# Patient Record
Sex: Male | Born: 1990 | Race: Black or African American | Hispanic: No | Marital: Single | Smoking: Current every day smoker
Health system: Southern US, Community
[De-identification: ages and names within clinical notes are randomized; demographics above are authoritative.]

---
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
## 2017-11-25 ENCOUNTER — Emergency Department (HOSPITAL_BASED_OUTPATIENT_CLINIC_OR_DEPARTMENT_OTHER): Payer: Self-pay

## 2017-11-25 ENCOUNTER — Emergency Department (HOSPITAL_BASED_OUTPATIENT_CLINIC_OR_DEPARTMENT_OTHER)
Admission: EM | Admit: 2017-11-25 | Discharge: 2017-11-25 | Disposition: A | Discharge status: Home / Self Care | Payer: Self-pay | Attending: Emergency Medicine | Admitting: Emergency Medicine

## 2017-11-25 ENCOUNTER — Encounter (HOSPITAL_BASED_OUTPATIENT_CLINIC_OR_DEPARTMENT_OTHER): Payer: Self-pay | Admitting: Emergency Medicine

## 2017-11-25 ENCOUNTER — Other Ambulatory Visit: Payer: Self-pay

## 2017-11-25 DIAGNOSIS — R042 Hemoptysis: Secondary | ICD-10-CM | POA: Insufficient documentation

## 2017-11-25 DIAGNOSIS — F17200 Nicotine dependence, unspecified, uncomplicated: Secondary | ICD-10-CM | POA: Insufficient documentation

## 2017-11-25 LAB — GROUP A STREP BY PCR: GROUP A STREP BY PCR: NOT DETECTED

## 2017-11-25 MED ORDER — ACETAMINOPHEN 325 MG PO TABS
650.0000 mg | ORAL_TABLET | Freq: Once | ORAL | Status: AC
  Administered 2017-11-25: 650 mg via ORAL
  Filled 2017-11-25: qty 2

## 2017-11-25 MED ORDER — PANTOPRAZOLE SODIUM 20 MG PO TBEC: 20.0000 mg | DELAYED_RELEASE_TABLET | Freq: Every day | ORAL | 0 refills | Status: DC

## 2017-11-25 NOTE — ED Triage Notes (Signed)
Chest congestion with hemoptysis since Sunday night..  Worse at night and in am.  No fever noted.  Some headache and cough.

## 2017-11-25 NOTE — ED Notes (Signed)
ED Provider at bedside. 

## 2017-11-25 NOTE — ED Notes (Signed)
Patient transported to X-ray 

## 2017-11-25 NOTE — Discharge Instructions (Addendum)
You have been diagnosed today with hemoptysis.  At this time there does not appear to be the presence of an emergent medical condition, however there is always the potential for conditions to worsen. Please read and follow the below instructions.  Please return to the Emergency Department immediately for any new or worsening symptoms or if your symptoms do not improve. Please be sure to follow up with your Primary Care Provider as soon as possible regarding your visit today; please call their office to schedule an appointment even if you are feeling better for a follow-up visit. Please take the Protonix daily as prescribed.  Please cut down on your alcohol intake, it is likely that your bloody cough is due to your increased alcohol intake and vomiting.  Contact a doctor if: You have a fever. You cough up bloody spit and mucus. Get help right away if: You cough up fresh blood or blood clots. You have trouble breathing. You have chest pain.  Please read the additional information packets attached to your discharge summary.  Do not take your medicine if  develop an itchy rash, swelling in your mouth or lips, or difficulty breathing.

## 2017-11-25 NOTE — ED Provider Notes (Signed)
Medical screening examination/treatment/procedure(s) were conducted as a shared visit with non-physician practitioner(s) and myself.  I personally evaluated the patient during the encounter. Briefly, the patient is a 27 y.o. male with no significant medical history presents the ED following episodes of some coughing up blood.  Patient with normal vitals.  No fever.  Patient states several episodes of some blood when he coughs.  Small amount possibly to cover a napkin.  Patient states that he is a heavy alcohol drinker.  Does not use any Tylenol, Motrin, Goody powders.  Patient with no abdominal pain.  Normal vitals upon my evaluation.  Overall well-appearing.  No active hemoptysis.  Chest x-ray showed no signs of pneumonia, pneumothorax, pleural effusion.  No free air on chest x-ray as well.  Patient with normal hemoglobin.  Suspect likely small Mallory-Weiss tear versus alcoholic gastritis.  Educated on alcohol use.  Told to avoid any Motrin, ibuprofen.  Given prescription for Protonix.  Recommend follow-up with primary care doctor and discharged from ED in good condition.  Given return precautions.  This chart was dictated using voice recognition software.  Despite best efforts to proofread,  errors can occur which can change the documentation meaning.    EKG Interpretation None            Virgina Norfolk, DO 11/25/17 1923

## 2017-11-25 NOTE — ED Provider Notes (Signed)
[facility] EMERGENCY DEPARTMENT Provider Note   CSN: [id number] Arrival date & time: 11/25/17  1632     History   Chief Complaint Chief Complaint  Patient presents with  . Hemoptysis    HPI Adam Bailey is a 27 y.o. male presenting today for cough and hemoptysis that began 3 nights ago.  Patient states that he developed a gradually worsening cough on Sunday evening with hemoptysis starting the following day.  Patient states that during the daytime his cough is very mild and without hemoptysis however at night and first thing in the morning his cough is worsened with increased hemoptysis.  He states that his cough is harsh and burns his throat.  Patient has not taken any medication for his symptoms.  Patient states that he is an otherwise healthy 27 year old male without history of chronic medical diseases however he is a smoker.  Patient denies chest pain, shortness of breath, fever, rhinorrhea, difficulty swallowing/breathing, extremity swelling/color change, abdominal pain, nausea/vomiting, diarrhea, weight loss, cancer history.  Patient denies recent surgery/immobilization, exogenous hormone use or history of blood clots.  Patient's mother is at bedside, they both deny family history of cardiac disease under the age of 50, sudden cardiac death, blood clots.  HPI  History reviewed. No pertinent past medical history.  There are no active problems to display for this patient.      Home Medications    Prior to Admission medications   Medication Sig Start Date End Date Taking? Authorizing Provider  pantoprazole (PROTONIX) 20 MG tablet Take 1 tablet (20 mg total) by mouth daily. 11/25/17   Bill Salinas, PA-C    Family History No family history on file.  Social History Social History   Tobacco Use  . Smoking status: Current Every Day Smoker  . Smokeless tobacco: Never Used  Substance Use Topics  . Alcohol use: Not on file  . Drug use: Not on file      Allergies   Patient has no known allergies.   Review of Systems Review of Systems  Constitutional: Negative.  Negative for chills and fever.  HENT: Positive for sore throat. Negative for congestion, rhinorrhea, trouble swallowing and voice change.   Respiratory: Positive for cough. Negative for shortness of breath.   Cardiovascular: Negative.  Negative for chest pain and leg swelling.  Gastrointestinal: Negative.  Negative for abdominal pain, nausea and vomiting.  Skin: Negative.  Negative for color change and rash.  Neurological: Negative.  Negative for weakness and headaches.   Physical Exam Updated Vital Signs BP 114/75   Pulse 84   Temp 98.6 F (37 C) (Oral)   Resp 18   Ht 6\' 1"  (1.854 m)   Wt 64.9 kg   SpO2 99%   BMI 18.87 kg/m   Physical Exam  Constitutional: He appears well-developed and well-nourished. No distress.  HENT:  Head: Normocephalic and atraumatic.  Right Ear: Hearing, tympanic membrane, external ear and ear canal normal.  Left Ear: Hearing, tympanic membrane, external ear and ear canal normal.  Nose: Mucosal edema and rhinorrhea present.  Mouth/Throat: Uvula is midline, oropharynx is clear and moist and mucous membranes are normal. No trismus in the jaw. No uvula swelling. No oropharyngeal exudate, posterior oropharyngeal edema, posterior oropharyngeal erythema or tonsillar abscesses.  The patient has normal phonation and is in control of secretions. No stridor.  Midline uvula without edema. Soft palate rises symmetrically. No tonsillar erythema, swelling or exudates. Tongue protrusion is normal, floor of mouth is soft. No  trismus. No creptius on neck palpation. No gingival erythema or fluctuance noted. Mucus membranes moist. No pallor noted.  Mild cobblestoning posterior pharynx consistent with postnasal drip.   Eyes: Pupils are equal, round, and reactive to light. Conjunctivae and EOM are normal.  Neck: Trachea normal, normal range of motion, full  passive range of motion without pain and phonation normal. Neck supple. No tracheal deviation present.  Cardiovascular: Normal rate, regular rhythm, normal heart sounds and intact distal pulses.  Pulses:      Radial pulses are 2+ on the right side, and 2+ on the left side.       Dorsalis pedis pulses are 2+ on the right side, and 2+ on the left side.       Posterior tibial pulses are 2+ on the right side, and 2+ on the left side.  Pulmonary/Chest: Effort normal and breath sounds normal. No respiratory distress. He exhibits no tenderness, no crepitus and no deformity.  Abdominal: Soft. Bowel sounds are normal. There is no tenderness. There is no rigidity, no rebound, no guarding, no tenderness at McBurney's point and negative Murphy's sign.  Musculoskeletal: Normal range of motion.       Right lower leg: Normal.       Left lower leg: Normal.  Feet:  Right Foot:  Protective Sensation: 3 sites tested. 3 sites sensed.  Left Foot:  Protective Sensation: 3 sites tested. 3 sites sensed.  Neurological: He is alert. GCS eye subscore is 4. GCS verbal subscore is 5. GCS motor subscore is 6.  Speech is clear and goal oriented, follows commands Major Cranial nerves without deficit, no facial droop Normal strength in upper and lower extremities bilaterally including dorsiflexion and plantar flexion, strong and equal grip strength Sensation normal to light touch Moves extremities without ataxia, coordination intact Normal gait  Skin: Skin is warm and dry. Capillary refill takes less than 2 seconds. He is not diaphoretic.  Psychiatric: He has a normal mood and affect. His behavior is normal.   ED Treatments / Results  Labs (all labs ordered are listed, but only abnormal results are displayed) Labs Reviewed  GROUP A STREP BY PCR    EKG None  Radiology Dg Chest 2 View  Result Date: 11/25/2017 CLINICAL DATA:  Cough with hemoptysis EXAM: CHEST - 2 VIEW COMPARISON:  None. FINDINGS: The heart size  and mediastinal contours are within normal limits. Both lungs are clear. The visualized skeletal structures are unremarkable. IMPRESSION: No active cardiopulmonary disease. Electronically Signed   By: Jasmine Pang M.D.   On: 11/25/2017 18:52    Procedures Procedures (including critical care time)  Medications Ordered in ED Medications  acetaminophen (TYLENOL) tablet 650 mg (650 mg Oral Given 11/25/17 1724)     Initial Impression / Assessment and Plan / ED Course  I have reviewed the triage vital signs and the nursing notes.  Pertinent labs & imaging results that were available during my care of the patient were reviewed by me and considered in my medical decision making (see chart for details).  Clinical Course as of Nov 26 1939  Wed Nov 25, 2017  1908 Seen and evaluated by Dr. Lockie Mola advises; Protonix 20mg  daily and avoid overuse of alcohol.   [BM]    Clinical Course User Index [BM] Bill Salinas, PA-C   27 year old otherwise healthy male presenting today with 3 days of hemoptysis.  Hemoptysis worse at night, minimal cough or hemoptysis during the day.  Strep test negative and chest x-ray unremarkable.  Patient initially mildly tachycardic upon arrival, after rest and Tylenol patient is no longer tachycardic.  Patient seen and evaluated by Dr. Lockie Mola during this visit, please see his note for further details.  In short patient revealed history of heavy alcohol use, likely today that patient with a small Mallory-Weiss tear versus alcoholic gastritis.  The patient has been prescribed Protonix 20 mg daily, discharge at this time and encouraged to follow-up with PCP for reevaluation.  Patient counseled on alcohol use by Dr. Lockie Mola.  Afebrile, not tachycardic, not hypotensive, not tachypneic, 90% SPO2 on room air.  Well-appearing and in no acute distress.  Patient and his mother both state understanding of work-up today and are agreeable with discharge.  At this time there  does not appear to be any evidence of an acute emergency medical condition and the patient appears stable for discharge with appropriate outpatient follow up. Diagnosis was discussed with patient who verbalizes understanding of care plan and is agreeable to discharge. I have discussed return precautions with patient and mother who verbalize understanding of return precautions. Patient strongly encouraged to follow-up with their PCP. All questions answered.   Note: Portions of this report may have been transcribed using voice recognition software. Every effort was made to ensure accuracy; however, inadvertent computerized transcription errors may still be present. Final Clinical Impressions(s) / ED Diagnoses   Final diagnoses:  Hemoptysis    ED Discharge Orders         Ordered    pantoprazole (PROTONIX) 20 MG tablet  Daily     11/25/17 1919           Elizabeth Palau 11/25/17 1947    Virgina Norfolk, DO 11/25/17 2311    Virgina Norfolk, DO 11/25/17 2311

## 2021-05-02 ENCOUNTER — Emergency Department (HOSPITAL_BASED_OUTPATIENT_CLINIC_OR_DEPARTMENT_OTHER)
Admission: EM | Admit: 2021-05-02 | Discharge: 2021-05-02 | Disposition: A | Discharge status: Home / Self Care | Payer: Self-pay | Attending: Emergency Medicine | Admitting: Emergency Medicine

## 2021-05-02 ENCOUNTER — Other Ambulatory Visit (HOSPITAL_BASED_OUTPATIENT_CLINIC_OR_DEPARTMENT_OTHER): Payer: Self-pay

## 2021-05-02 ENCOUNTER — Encounter (HOSPITAL_BASED_OUTPATIENT_CLINIC_OR_DEPARTMENT_OTHER): Payer: Self-pay | Admitting: Emergency Medicine

## 2021-05-02 ENCOUNTER — Emergency Department (HOSPITAL_BASED_OUTPATIENT_CLINIC_OR_DEPARTMENT_OTHER): Payer: Self-pay

## 2021-05-02 ENCOUNTER — Other Ambulatory Visit: Payer: Self-pay

## 2021-05-02 DIAGNOSIS — D72819 Decreased white blood cell count, unspecified: Secondary | ICD-10-CM | POA: Insufficient documentation

## 2021-05-02 DIAGNOSIS — J40 Bronchitis, not specified as acute or chronic: Secondary | ICD-10-CM | POA: Insufficient documentation

## 2021-05-02 DIAGNOSIS — R051 Acute cough: Secondary | ICD-10-CM

## 2021-05-02 DIAGNOSIS — Z87891 Personal history of nicotine dependence: Secondary | ICD-10-CM | POA: Insufficient documentation

## 2021-05-02 LAB — CBC
HCT: 38.8 % — ABNORMAL LOW (ref 39.0–52.0)
Hemoglobin: 13.4 g/dL (ref 13.0–17.0)
MCH: 31.3 pg (ref 26.0–34.0)
MCHC: 34.5 g/dL (ref 30.0–36.0)
MCV: 90.7 fL (ref 80.0–100.0)
Platelets: 252 10*3/uL (ref 150–400)
RBC: 4.28 MIL/uL (ref 4.22–5.81)
RDW: 11.9 % (ref 11.5–15.5)
WBC: 2.8 10*3/uL — ABNORMAL LOW (ref 4.0–10.5)
nRBC: 0 % (ref 0.0–0.2)

## 2021-05-02 LAB — COMPREHENSIVE METABOLIC PANEL
ALT: 19 U/L (ref 0–44)
AST: 24 U/L (ref 15–41)
Albumin: 4.4 g/dL (ref 3.5–5.0)
Alkaline Phosphatase: 72 U/L (ref 38–126)
Anion gap: 9 (ref 5–15)
BUN: 7 mg/dL (ref 6–20)
CO2: 24 mmol/L (ref 22–32)
Calcium: 9.2 mg/dL (ref 8.9–10.3)
Chloride: 100 mmol/L (ref 98–111)
Creatinine, Ser: 0.72 mg/dL (ref 0.61–1.24)
GFR, Estimated: >60 mL/min (ref >60)
Glucose, Bld: 104 mg/dL — ABNORMAL HIGH (ref 70–99)
Potassium: 3.6 mmol/L (ref 3.5–5.1)
Sodium: 133 mmol/L — ABNORMAL LOW (ref 135–145)
Total Bilirubin: 0.7 mg/dL (ref 0.3–1.2)
Total Protein: 8 g/dL (ref 6.5–8.1)

## 2021-05-02 MED ORDER — DEXAMETHASONE 4 MG PO TABS
10.0000 mg | ORAL_TABLET | Freq: Once | ORAL | Status: AC
  Administered 2021-05-02: 10 mg via ORAL
  Filled 2021-05-02: qty 3

## 2021-05-02 MED ORDER — AZITHROMYCIN 250 MG PO TABS
250.0000 mg | ORAL_TABLET | Freq: Every day | ORAL | 0 refills | Status: DC
  Filled 2021-05-02: qty 6, 5d supply, fill #0

## 2021-05-02 NOTE — ED Provider Notes (Signed)
?[facility] EMERGENCY DEPARTMENT ?Provider Note ? ? ?CSN: [id number] ?Arrival date & time: 05/02/21  1335 ? ?  ? ?History ? ?Chief Complaint  ?Patient presents with  ? Hemoptysis  ? ? ?Adam Bailey is a 31 y.o. male. ? ?Patient here after coughing up some blood.  This occurred after smoking.  History of smoking but no other major medical problems.  Denies any recent surgery, travel, blood clot history.  No family history of blood clots.  States he used to be on reflux medicine.  No longer drinks alcohol.  Denies any vomiting of blood.  Symptoms have resolved.  Denies any chest pain or shortness of breath.  Nothing has made it better or worse. ? ?The history is provided by the patient.  ? ?  ? ?Home Medications ?Prior to Admission medications   ?Medication Sig Start Date End Date Taking? Authorizing Provider  ?azithromycin (ZITHROMAX) 250 MG tablet Take 1 tablet (250 mg total) by mouth daily. Take first 2 tablets together, then 1 every day until finished. 05/02/21  Yes Adam Elsass, DO  ?pantoprazole (PROTONIX) 20 MG tablet Take 1 tablet (20 mg total) by mouth daily. 11/25/17   Adam Salinas, PA-C  ?   ? ?Allergies    ?Patient has no known allergies.   ? ?Review of Systems   ?Review of Systems ? ?Physical Exam ?Updated Vital Signs ?BP 139/90 (BP Location: Right Arm)   Pulse 76   Temp 98.3 ?F (36.8 ?C) (Oral)   Resp 18   Ht 5\' 10"  (1.778 m)   Wt 67.1 kg   SpO2 100%   BMI 21.24 kg/m?  ?Physical Exam ?Vitals and nursing note reviewed.  ?Constitutional:   ?   General: He is not in acute distress. ?   Appearance: He is well-developed. He is not ill-appearing.  ?HENT:  ?   Head: Normocephalic and atraumatic.  ?   Mouth/Throat:  ?   Mouth: Mucous membranes are moist.  ?Eyes:  ?   Extraocular Movements: Extraocular movements intact.  ?   Conjunctiva/sclera: Conjunctivae normal.  ?   Pupils: Pupils are equal, round, and reactive to light.  ?Cardiovascular:  ?   Rate and Rhythm: Normal rate and regular  rhythm.  ?   Pulses: Normal pulses.  ?   Heart sounds: Normal heart sounds. No murmur heard. ?Pulmonary:  ?   Effort: Pulmonary effort is normal. No respiratory distress.  ?   Breath sounds: Normal breath sounds.  ?Abdominal:  ?   Palpations: Abdomen is soft.  ?   Tenderness: There is no abdominal tenderness.  ?Musculoskeletal:     ?   General: No swelling.  ?   Cervical back: Normal range of motion and neck supple.  ?Skin: ?   General: Skin is warm and dry.  ?   Capillary Refill: Capillary refill takes less than 2 seconds.  ?Neurological:  ?   Mental Status: He is alert.  ?Psychiatric:     ?   Mood and Affect: Mood normal.  ? ? ?ED Results / Procedures / Treatments   ?Labs ?(all labs ordered are listed, but only abnormal results are displayed) ?Labs Reviewed  ?CBC - Abnormal; Notable for the following components:  ?    Result Value  ? WBC 2.8 (*)   ? HCT 38.8 (*)   ? All other components within normal limits  ?COMPREHENSIVE METABOLIC PANEL - Abnormal; Notable for the following components:  ? Sodium 133 (*)   ? Glucose,  Bld 104 (*)   ? All other components within normal limits  ? ? ?EKG ?None ? ?Radiology ?DG Chest Portable 1 View ? ?Result Date: 05/02/2021 ?CLINICAL DATA:  Cough. EXAM: PORTABLE CHEST 1 VIEW COMPARISON:  Chest two views 11/25/2017 FINDINGS: Cardiac silhouette and mediastinal contours are within normal limits. The lungs are clear. No pleural effusion or pneumothorax. No acute skeletal abnormality. IMPRESSION: No active disease. Electronically Signed   By: Neita Garnet M.D.   On: 05/02/2021 15:17   ? ?Procedures ?Procedures  ? ? ?Medications Ordered in ED ?Medications  ?dexamethasone (DECADRON) tablet 10 mg (has no administration in time range)  ? ? ?ED Course/ Medical Decision Making/ A&P ?  ?                        ?Medical Decision Making ?Amount and/or Complexity of Data Reviewed ?Labs: ordered. ?Radiology: ordered. ? ?Risk ?Prescription drug management. ? ? ?Adam Bailey is here with cough and 1  episode of may be some blood in his sputum.  Denies any fevers or chills.  Normal vitals.  No fever.  Patient is PERC negative and doubt PE.  Is a smoker and likely inflammatory or bronchitis process.  Possibly viral process.  Possibly pneumonia.  We will check basic labs including CBC, BMP, chest x-ray.  We will treat with a dose of Decadron plus or minus antibiotics depending on chest x-ray. ? ?Chest x-ray per my review and interpretation shows no pneumonia.  Will treat with Decadron and Z-Pak for likely bronchitis/inflammation.  Per my review and interpretation of labs there is no significant anemia, electrolyte abnormality.  White count is 2.8 likely from viral suppression.  Discharged in good condition. ? ?This chart was dictated using voice recognition software.  Despite best efforts to proofread,  errors can occur which can change the documentation meaning.  ? ? ? ? ? ? ? ?Final Clinical Impression(s) / ED Diagnoses ?Final diagnoses:  ?Acute cough  ?Bronchitis  ? ? ?Rx / DC Orders ?ED Discharge Orders   ? ?      Ordered  ?  azithromycin (ZITHROMAX) 250 MG tablet  Daily       ? 05/02/21 1521  ? ?  ?  ? ?  ? ? ?  ?Virgina Norfolk, DO ?05/02/21 1522 ? ?

## 2021-05-02 NOTE — ED Triage Notes (Signed)
Pt states that he stopped drinking 1.5 years ago  ?

## 2021-05-02 NOTE — Discharge Instructions (Addendum)
Chest x-ray showed no evidence of pneumonia.  I have treated you with a long-acting steroid to help with inflammation.  I have also prescribed an antibiotic to help with inflammation.  Please consider stopping smoking. ?

## 2021-05-02 NOTE — ED Triage Notes (Signed)
Pt here from work was smoking and started coughing and spit up some blood , no others complaints  ?

## 2022-12-03 IMAGING — DX DG CHEST 1V PORT
2 series · 2 of 2 positions shown · non-contrast
Comparison: Chest two views 11/25/2017

CLINICAL DATA: Cough.

EXAM:
PORTABLE CHEST 1 VIEW

[chest ap (1 of 2)]
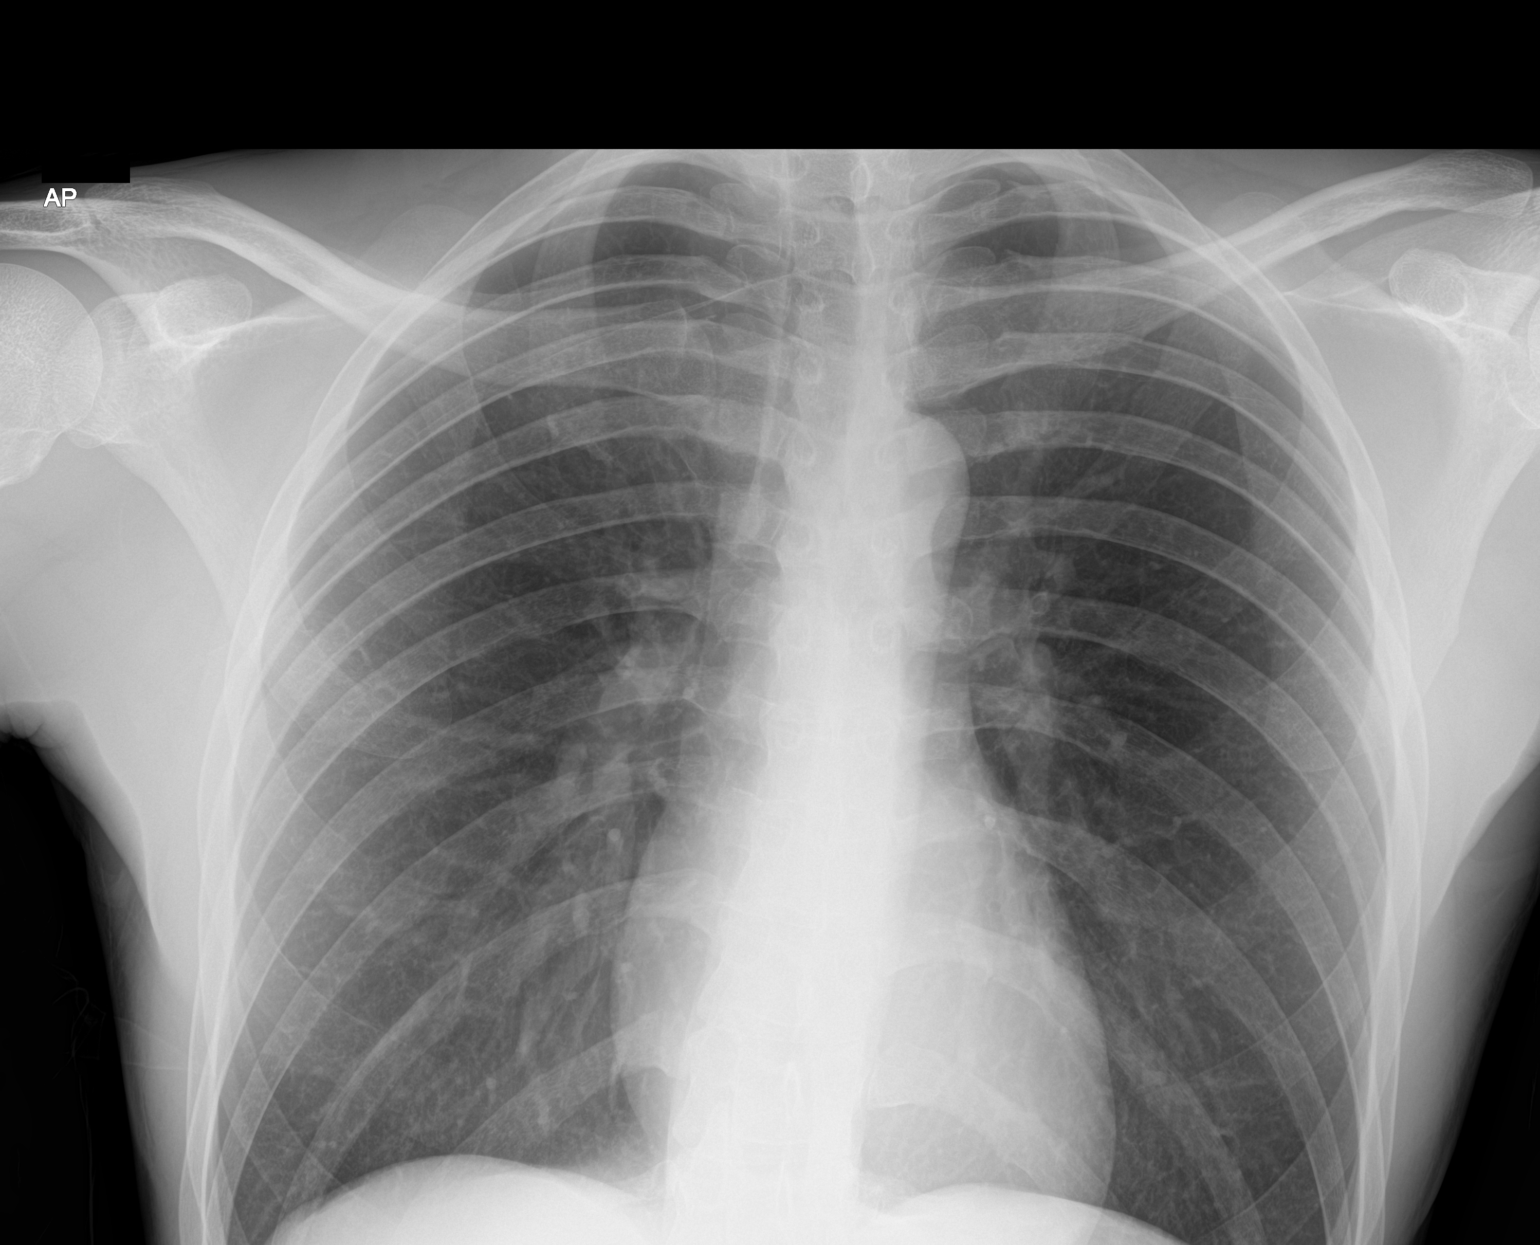

[chest ap (2 of 2)]
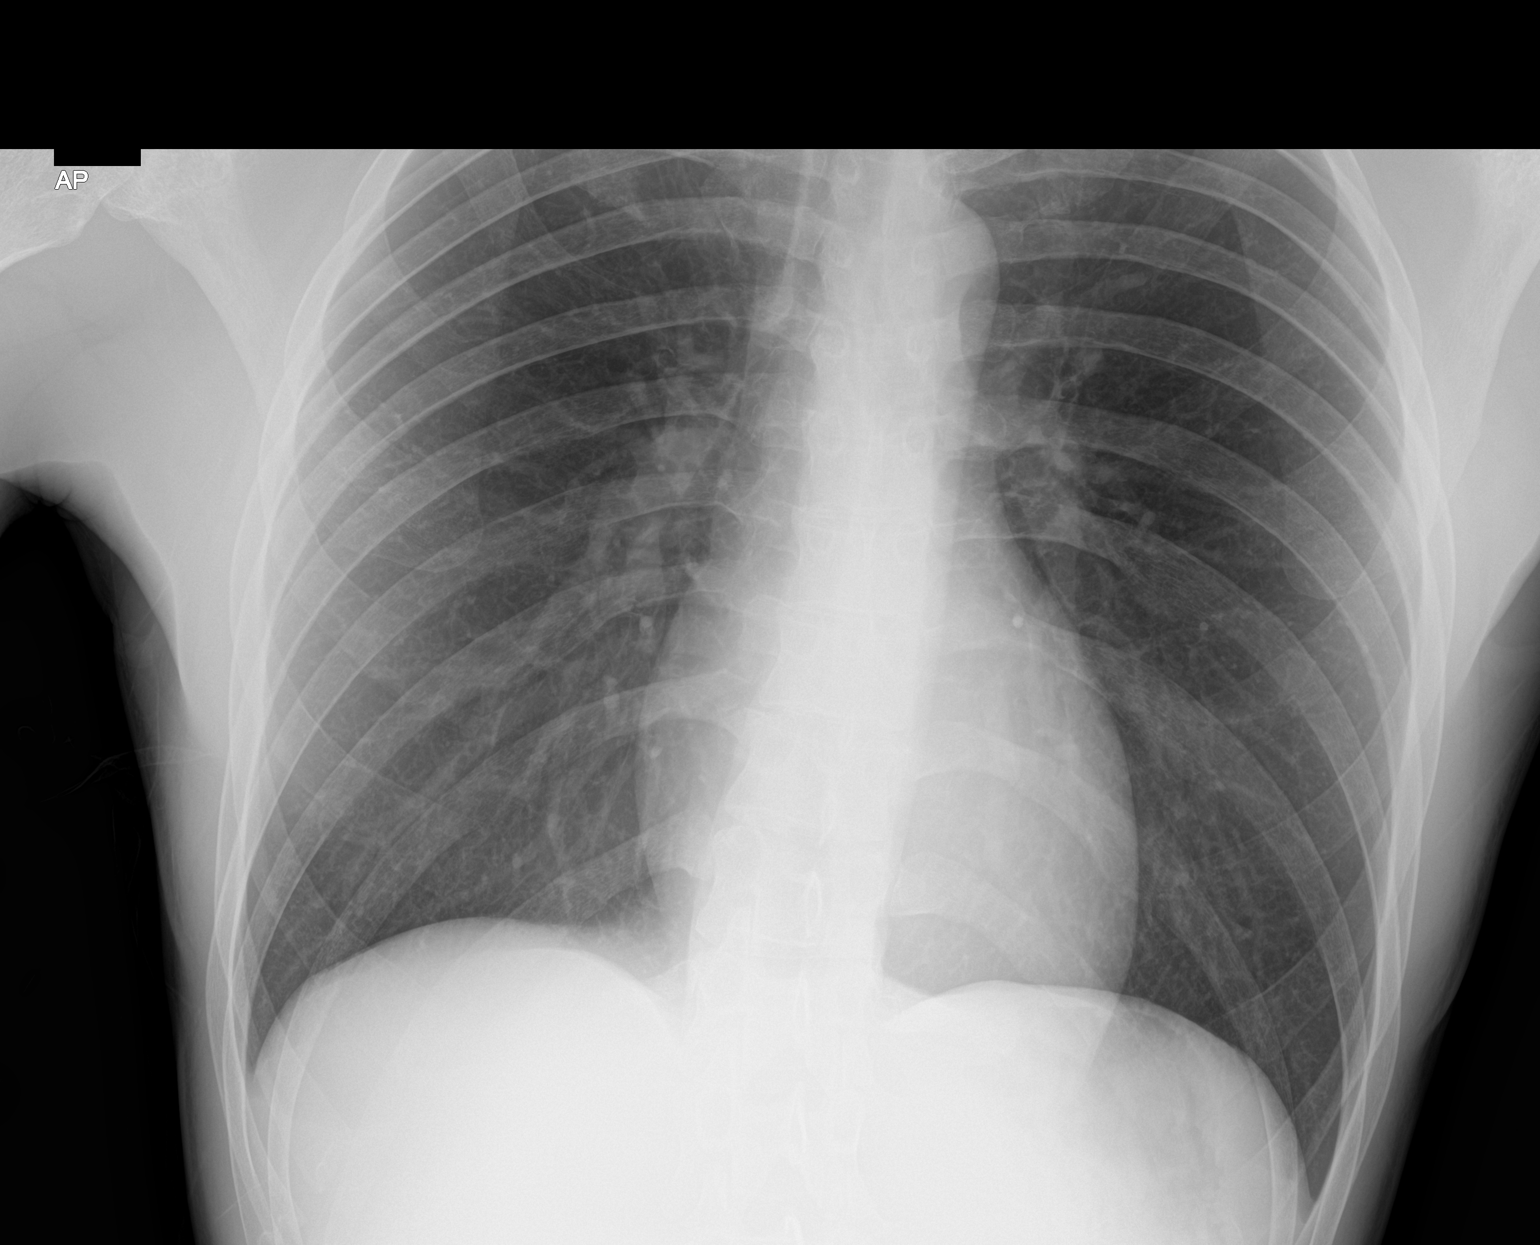

[2 of 2 positions shown; findings below may reference images not displayed]

FINDINGS: Cardiac silhouette and mediastinal contours are within normal
limits. The lungs are clear. No pleural effusion or pneumothorax. No
acute skeletal abnormality.
IMPRESSION: No active disease.

## 2023-08-02 ENCOUNTER — Encounter (HOSPITAL_COMMUNITY): Payer: Self-pay

## 2023-08-02 ENCOUNTER — Ambulatory Visit (HOSPITAL_COMMUNITY)
Admission: EM | Admit: 2023-08-02 | Discharge: 2023-08-02 | Disposition: A | Discharge status: Home / Self Care | Payer: Self-pay | Attending: Emergency Medicine | Admitting: Emergency Medicine

## 2023-08-02 DIAGNOSIS — R197 Diarrhea, unspecified: Secondary | ICD-10-CM

## 2023-08-02 DIAGNOSIS — B349 Viral infection, unspecified: Secondary | ICD-10-CM

## 2023-08-02 DIAGNOSIS — R051 Acute cough: Secondary | ICD-10-CM

## 2023-08-02 MED ORDER — AZELASTINE HCL 0.1 % NA SOLN: 2.0000 | Freq: Two times a day (BID) | NASAL | 0 refills | Status: AC

## 2023-08-02 MED ORDER — LOPERAMIDE HCL 2 MG PO CAPS: 2.0000 mg | ORAL_CAPSULE | Freq: Four times a day (QID) | ORAL | 0 refills | Status: AC | PRN

## 2023-08-02 MED ORDER — BENZONATATE 100 MG PO CAPS: 100.0000 mg | ORAL_CAPSULE | Freq: Three times a day (TID) | ORAL | 0 refills | Status: AC

## 2023-08-02 MED ORDER — PROMETHAZINE-DM 6.25-15 MG/5ML PO SYRP: 5.0000 mL | ORAL_SOLUTION | Freq: Every evening | ORAL | 0 refills | Status: AC | PRN

## 2023-08-02 NOTE — ED Triage Notes (Signed)
 Patient has multiple complaints. Patient reports that he began having nasal congestion and a productive cough with light yellow and specks of blood in the sputum, intermittent diarrhea, headache, fatigue, and abdominal cramping x 6 days.  Patient denies taking any medication for his symptoms.

## 2023-08-02 NOTE — Discharge Instructions (Addendum)
 As discussed I believe your symptoms are likely related to a viral illness. You can take Tessalon  every 8 hours as needed for cough. You can take Promethazine  DM cough syrup at bedtime as needed for cough.  This can make you drowsy so do not drive, work, or drink alcohol while taking this. Use azelastine  nasal spray twice daily to help with congestion. You can take Imodium  4 times daily as needed for diarrhea. Otherwise make sure you are staying hydrated and getting plenty of rest. Return here if your symptoms persist or worsen. If you develop excessive vomiting, severe abdominal pain, fevers unrelieved by medication, passing out, or coughing up a significant amount of blood please seek immediate medical treatment in the emergency department.

## 2023-08-02 NOTE — ED Provider Notes (Signed)
 [facility]    CSN: [id number] Arrival date & time: 08/02/23  1004      History   Chief Complaint Chief Complaint  Patient presents with   Abdominal Pain   Cough   Diarrhea   Fatigue   Headache    HPI Adam Bailey is a 33 y.o. male.   Patient presents with congestion and productive cough that began about 6 days ago.  Patient states that he has noticed a few specks of blood in his sputum began yesterday.  Patient states that he also did have some chills and sweats overnight last night.  Denies coughing up large amounts of blood or blood clots.  Denies chest pain, shortness of breath, and known fever.  Patient states that he has also had some intermittent abdominal cramping, diarrhea, fatigue, and intermittent headache over the last 6 days as well.  Patient does endorse some mild nausea with this as well.  Denies vomiting, blood in stool, dizziness, lightheadedness, or loss of consciousness.    Patient denies taking any medications for symptoms.  Patient denies any known sick exposures.  The history is provided by the patient and medical records.  Abdominal Pain Associated symptoms: cough and diarrhea   Cough Associated symptoms: headaches   Diarrhea Associated symptoms: abdominal pain and headaches   Headache Associated symptoms: abdominal pain, cough and diarrhea     History reviewed. No pertinent past medical history.  There are no active problems to display for this patient.   History reviewed. No pertinent surgical history.     Home Medications    Prior to Admission medications   Medication Sig Start Date End Date Taking? Authorizing Provider  azelastine  (ASTELIN ) 0.1 % nasal spray Place 2 sprays into both nostrils 2 (two) times daily. Use in each nostril as directed 08/02/23  Yes Johnie Flaming A, NP  benzonatate  (TESSALON ) 100 MG capsule Take 1 capsule (100 mg total) by mouth every 8 (eight) hours. 08/02/23  Yes Johnie, Paiten Boies A, NP  loperamide   (IMODIUM ) 2 MG capsule Take 1 capsule (2 mg total) by mouth 4 (four) times daily as needed for diarrhea or loose stools. 08/02/23  Yes Johnie Flaming A, NP  promethazine -dextromethorphan (PROMETHAZINE -DM) 6.25-15 MG/5ML syrup Take 5 mLs by mouth at bedtime as needed for cough. 08/02/23  Yes Johnie Flaming LABOR, NP    Family History Family History  Problem Relation Age of Onset   Diabetes Mother     Social History Social History   Tobacco Use   Smoking status: Every Day    Current packs/day: 0.50    Types: Cigarettes   Smokeless tobacco: Never  Vaping Use   Vaping status: Never Used  Substance Use Topics   Alcohol use: Yes    Comment: drinks daily  3-4 beers   Drug use: Never     Allergies   Other   Review of Systems Review of Systems  Respiratory:  Positive for cough.   Gastrointestinal:  Positive for abdominal pain and diarrhea.  Neurological:  Positive for headaches.   Per HPI  Physical Exam Triage Vital Signs ED Triage Vitals  Encounter Vitals Group     BP 08/02/23 1032 (!) 142/96     Girls Systolic BP Percentile --      Girls Diastolic BP Percentile --      Boys Systolic BP Percentile --      Boys Diastolic BP Percentile --      Pulse Rate 08/02/23 1032 97  Resp 08/02/23 1032 14     Temp 08/02/23 1032 98 F (36.7 C)     Temp Source 08/02/23 1032 Oral     SpO2 08/02/23 1032 97 %     Weight --      Height --      Head Circumference --      Peak Flow --      Pain Score 08/02/23 1031 3     Pain Loc --      Pain Education --      Exclude from Growth Chart --    No data found.  Updated Vital Signs BP (!) 142/96 (BP Location: Left Arm)   Pulse 97   Temp 98 F (36.7 C) (Oral)   Resp 14   SpO2 97%   Visual Acuity Right Eye Distance:   Left Eye Distance:   Bilateral Distance:    Right Eye Near:   Left Eye Near:    Bilateral Near:     Physical Exam Vitals and nursing note reviewed.  Constitutional:      General: He is awake. He is not in  acute distress.    Appearance: Normal appearance. He is well-developed and well-groomed. He is not ill-appearing.  HENT:     Right Ear: Tympanic membrane, ear canal and external ear normal.     Left Ear: Tympanic membrane, ear canal and external ear normal.     Nose: Congestion and rhinorrhea present.     Mouth/Throat:     Mouth: Mucous membranes are moist.     Pharynx: Posterior oropharyngeal erythema and postnasal drip present. No oropharyngeal exudate.  Cardiovascular:     Rate and Rhythm: Normal rate and regular rhythm.  Pulmonary:     Effort: Pulmonary effort is normal.     Breath sounds: Normal breath sounds.  Abdominal:     General: Abdomen is flat. Bowel sounds are normal. There is no distension.     Palpations: Abdomen is soft.     Tenderness: There is no abdominal tenderness. There is no guarding or rebound.  Musculoskeletal:        General: Normal range of motion.  Skin:    General: Skin is warm and dry.  Neurological:     General: No focal deficit present.     Mental Status: He is alert and oriented to person, place, and time. Mental status is at baseline.  Psychiatric:        Behavior: Behavior is cooperative.      UC Treatments / Results  Labs (all labs ordered are listed, but only abnormal results are displayed) Labs Reviewed - No data to display  EKG   Radiology No results found.  Procedures Procedures (including critical care time)  Medications Ordered in UC Medications - No data to display  Initial Impression / Assessment and Plan / UC Course  I have reviewed the triage vital signs and the nursing notes.  Pertinent labs & imaging results that were available during my care of the patient were reviewed by me and considered in my medical decision making (see chart for details).     Patient is overall well-appearing.  Vitals are stable.  Upon assessment mild congestion and rhinorrhea are present, mild erythema and PND noted to pharynx.  Lungs clear  bilaterally to auscultation.  Nontender upon palpation abdomen.  Symptoms likely viral in nature.  Prescribed Tessalon  and promethazine  DM as needed for cough.  Prescribed azelastine  for congestion.  Prescribed Imodium  as needed for diarrhea.  Discussed importance of hydration.  Discussed follow-up, return, and strict ER precautions. Final Clinical Impressions(s) / UC Diagnoses   Final diagnoses:  Viral syndrome  Acute cough  Diarrhea, unspecified type     Discharge Instructions      As discussed I believe your symptoms are likely related to a viral illness. You can take Tessalon  every 8 hours as needed for cough. You can take Promethazine  DM cough syrup at bedtime as needed for cough.  This can make you drowsy so do not drive, work, or drink alcohol while taking this. Use azelastine  nasal spray twice daily to help with congestion. You can take Imodium  4 times daily as needed for diarrhea. Otherwise make sure you are staying hydrated and getting plenty of rest. Return here if your symptoms persist or worsen. If you develop excessive vomiting, severe abdominal pain, fevers unrelieved by medication, passing out, or coughing up a significant amount of blood please seek immediate medical treatment in the emergency department.   ED Prescriptions     Medication Sig Dispense Auth. Provider   benzonatate  (TESSALON ) 100 MG capsule Take 1 capsule (100 mg total) by mouth every 8 (eight) hours. 21 capsule Johnie, Laverda Stribling A, NP   azelastine  (ASTELIN ) 0.1 % nasal spray Place 2 sprays into both nostrils 2 (two) times daily. Use in each nostril as directed 30 mL Johnie Flaming A, NP   promethazine -dextromethorphan (PROMETHAZINE -DM) 6.25-15 MG/5ML syrup Take 5 mLs by mouth at bedtime as needed for cough. 118 mL Johnie Flaming A, NP   loperamide  (IMODIUM ) 2 MG capsule Take 1 capsule (2 mg total) by mouth 4 (four) times daily as needed for diarrhea or loose stools. 12 capsule Johnie Flaming A, NP       PDMP not reviewed this encounter.   Johnie Flaming A, NP 08/02/23 1104

## 2023-08-10 ENCOUNTER — Ambulatory Visit (INDEPENDENT_AMBULATORY_CARE_PROVIDER_SITE_OTHER): Payer: Self-pay | Admitting: Nurse Practitioner

## 2023-08-10 ENCOUNTER — Encounter: Payer: Self-pay | Admitting: Nurse Practitioner

## 2023-08-10 VITALS — BP 122/77 | HR 88 | Temp 98.5°F | Ht 70.0 in | Wt 149.0 lb

## 2023-08-10 DIAGNOSIS — Z0000 Encounter for general adult medical examination without abnormal findings: Secondary | ICD-10-CM | POA: Diagnosis not present

## 2023-08-10 DIAGNOSIS — Z1329 Encounter for screening for other suspected endocrine disorder: Secondary | ICD-10-CM | POA: Diagnosis not present

## 2023-08-10 DIAGNOSIS — Z13220 Encounter for screening for lipoid disorders: Secondary | ICD-10-CM

## 2023-08-10 DIAGNOSIS — Z113 Encounter for screening for infections with a predominantly sexual mode of transmission: Secondary | ICD-10-CM | POA: Diagnosis not present

## 2023-08-10 NOTE — Progress Notes (Signed)
 Subjective   Patient ID: Adam Bailey, male    DOB: 1990-11-15, 33 y.o.   MRN: [id number]  Chief Complaint  Patient presents with   Establish Care    Std\ check in blood work     Referring provider: No ref. provider found  Gideon Burstein is a 33 y.o. male with No past medical history on file.   HPI  Patient presents today to establish care.  Overall he has been healthy.  No significant health history.  He is not currently on any medications.  He would like complete STD check today with labs for routine health maintenance as well.  He states that he has been drinking heavily but has tried to stop over the past week.  He declines referral for rehab at this time.  Denies f/c/s, n/v/d, hemoptysis, PND, leg swelling Denies chest pain or edema     Allergies  Allergen Reactions   Other     Pineapple     There is no immunization history on file for this patient.  Tobacco History: Social History   Tobacco Use  Smoking Status Every Day   Current packs/day: 0.50   Types: Cigarettes  Smokeless Tobacco Never   Ready to quit: Not Answered Counseling given: Yes   Outpatient Encounter Medications as of 08/10/2023  Medication Sig   azelastine  (ASTELIN ) 0.1 % nasal spray Place 2 sprays into both nostrils 2 (two) times daily. Use in each nostril as directed   benzonatate  (TESSALON ) 100 MG capsule Take 1 capsule (100 mg total) by mouth every 8 (eight) hours.   loperamide  (IMODIUM ) 2 MG capsule Take 1 capsule (2 mg total) by mouth 4 (four) times daily as needed for diarrhea or loose stools.   promethazine -dextromethorphan (PROMETHAZINE -DM) 6.25-15 MG/5ML syrup Take 5 mLs by mouth at bedtime as needed for cough.   No facility-administered encounter medications on file as of 08/10/2023.    Review of Systems  Review of Systems  Constitutional: Negative.   HENT: Negative.    Cardiovascular: Negative.   Gastrointestinal: Negative.   Allergic/Immunologic: Negative.   Neurological:  Negative.   Psychiatric/Behavioral: Negative.       Objective:   BP 122/77   Pulse 88   Temp 98.5 F (36.9 C) (Oral)   Ht 5' 10 (1.778 m)   Wt 149 lb (67.6 kg)   SpO2 100%   BMI 21.38 kg/m   Wt Readings from Last 5 Encounters:  08/10/23 149 lb (67.6 kg)  05/02/21 148 lb (67.1 kg)  11/25/17 143 lb (64.9 kg)     Physical Exam Vitals and nursing note reviewed.  Constitutional:      General: He is not in acute distress.    Appearance: He is well-developed.  Cardiovascular:     Rate and Rhythm: Normal rate and regular rhythm.  Pulmonary:     Effort: Pulmonary effort is normal.     Breath sounds: Normal breath sounds.  Skin:    General: Skin is warm and dry.  Neurological:     Mental Status: He is alert and oriented to person, place, and time.       Assessment & Plan:   Routine adult health maintenance -     CBC -     Comprehensive metabolic panel with GFR  Lipid screening -     Lipid panel  Thyroid disorder screen -     TSH  Screen for STD (sexually transmitted disease) -     RPR+HIV+GC+CT Panel -  Chlamydia/Gonococcus/Trichomonas, NAA     Return in about 1 year (around 08/09/2024) for Physical.   Bascom GORMAN Borer, NP 08/10/2023

## 2023-08-11 LAB — LIPID PANEL
Chol/HDL Ratio: 2.4 ratio (ref 0.0–5.0)
Cholesterol, Total: 148 mg/dL (ref 100–199)
HDL: 61 mg/dL (ref >39)
LDL Chol Calc (NIH): 68 mg/dL (ref 0–99)
Triglycerides: 102 mg/dL (ref 0–149)
VLDL Cholesterol Cal: 19 mg/dL (ref 5–40)

## 2023-08-11 LAB — CBC
Hematocrit: 38.8 % (ref 37.5–51.0)
Hemoglobin: 13.2 g/dL (ref 13.0–17.7)
MCH: 33.2 pg — ABNORMAL HIGH (ref 26.6–33.0)
MCHC: 34 g/dL (ref 31.5–35.7)
MCV: 98 fL — ABNORMAL HIGH (ref 79–97)
Platelets: 265 x10E3/uL (ref 150–450)
RBC: 3.97 x10E6/uL — ABNORMAL LOW (ref 4.14–5.80)
RDW: 11.9 % (ref 11.6–15.4)
WBC: 4.7 x10E3/uL (ref 3.4–10.8)

## 2023-08-11 LAB — COMPREHENSIVE METABOLIC PANEL WITH GFR
ALT: 24 IU/L (ref 0–44)
AST: 27 IU/L (ref 0–40)
Albumin: 4.5 g/dL (ref 4.3–5.2)
Alkaline Phosphatase: 73 IU/L (ref 44–121)
BUN/Creatinine Ratio: 12 (ref 9–20)
BUN: 10 mg/dL (ref 6–20)
Bilirubin Total: 0.3 mg/dL (ref 0.0–1.2)
CO2: 21 mmol/L (ref 20–29)
Calcium: 9.7 mg/dL (ref 8.7–10.2)
Chloride: 102 mmol/L (ref 96–106)
Creatinine, Ser: 0.82 mg/dL (ref 0.76–1.27)
Globulin, Total: 3.2 g/dL (ref 1.5–4.5)
Glucose: 73 mg/dL (ref 70–99)
Potassium: 4.2 mmol/L (ref 3.5–5.2)
Sodium: 137 mmol/L (ref 134–144)
Total Protein: 7.7 g/dL (ref 6.0–8.5)
eGFR: 121 mL/min/1.73 (ref >59)

## 2023-08-11 LAB — TSH: TSH: 0.719 u[IU]/mL (ref 0.450–4.500)

## 2023-08-12 ENCOUNTER — Ambulatory Visit: Payer: Self-pay | Admitting: Nurse Practitioner

## 2023-08-12 LAB — CHLAMYDIA/GONOCOCCUS/TRICHOMONAS, NAA
Chlamydia by NAA: NEGATIVE
Gonococcus by NAA: NEGATIVE
Trich vag by NAA: NEGATIVE

## 2023-08-12 LAB — RPR+HIV+GC+CT PANEL
HIV Screen 4th Generation wRfx: NONREACTIVE
RPR Ser Ql: NONREACTIVE

## 2024-08-10 ENCOUNTER — Encounter: Payer: Self-pay | Admitting: Nurse Practitioner
